# Patient Record
Sex: Female | Born: 1960 | Race: Black or African American | Hispanic: No
Health system: Southern US, Community
[De-identification: ages and names within clinical notes are randomized; demographics above are authoritative.]

## PROBLEM LIST (undated history)

## (undated) DIAGNOSIS — I639 Cerebral infarction, unspecified: Secondary | ICD-10-CM

## (undated) HISTORY — DX: Cerebral infarction, unspecified: I63.9

---

## 2008-04-22 DIAGNOSIS — I639 Cerebral infarction, unspecified: Secondary | ICD-10-CM

## 2008-04-22 HISTORY — DX: Cerebral infarction, unspecified: I63.9

## 2018-03-16 ENCOUNTER — Other Ambulatory Visit (HOSPITAL_COMMUNITY): Payer: Self-pay | Admitting: Family Medicine

## 2018-03-16 DIAGNOSIS — R05 Cough: Secondary | ICD-10-CM

## 2018-03-16 DIAGNOSIS — R053 Chronic cough: Secondary | ICD-10-CM

## 2018-03-18 ENCOUNTER — Encounter (HOSPITAL_COMMUNITY): Payer: Self-pay

## 2018-03-18 ENCOUNTER — Ambulatory Visit (HOSPITAL_COMMUNITY): Payer: Medicaid Other

## 2018-03-23 ENCOUNTER — Ambulatory Visit (HOSPITAL_COMMUNITY)
Admission: RE | Admit: 2018-03-23 | Discharge: 2018-03-23 | Disposition: A | Discharge status: Home / Self Care | Payer: Medicaid Other | Source: Ambulatory Visit | Attending: Family Medicine | Admitting: Family Medicine

## 2018-03-23 ENCOUNTER — Encounter (HOSPITAL_COMMUNITY): Payer: Self-pay

## 2018-03-23 DIAGNOSIS — R05 Cough: Secondary | ICD-10-CM | POA: Insufficient documentation

## 2018-03-23 DIAGNOSIS — R053 Chronic cough: Secondary | ICD-10-CM

## 2018-03-27 ENCOUNTER — Ambulatory Visit: Payer: Medicaid Other | Admitting: Physical Therapy

## 2018-04-01 ENCOUNTER — Ambulatory Visit: Payer: Medicaid Other | Admitting: Physical Therapy

## 2018-04-08 ENCOUNTER — Other Ambulatory Visit: Payer: Self-pay

## 2018-04-08 ENCOUNTER — Ambulatory Visit: Payer: Medicaid Other | Attending: Family Medicine | Admitting: Physical Therapy

## 2018-04-08 ENCOUNTER — Encounter: Payer: Self-pay | Admitting: Physical Therapy

## 2018-04-08 DIAGNOSIS — R296 Repeated falls: Secondary | ICD-10-CM | POA: Insufficient documentation

## 2018-04-08 DIAGNOSIS — R2689 Other abnormalities of gait and mobility: Secondary | ICD-10-CM | POA: Insufficient documentation

## 2018-04-08 DIAGNOSIS — I69951 Hemiplegia and hemiparesis following unspecified cerebrovascular disease affecting right dominant side: Secondary | ICD-10-CM | POA: Diagnosis not present

## 2018-04-08 DIAGNOSIS — R2681 Unsteadiness on feet: Secondary | ICD-10-CM | POA: Diagnosis present

## 2018-04-08 NOTE — Therapy (Addendum)
[facility] [address] [place], [place], [address] Phone: [phone]   Fax:  [phone]  Physical Therapy Evaluation  Patient Details  Name: Megan Carson MRN: [id number] Date of Birth: 1960-10-04 Referring Provider (PT): Laruth Bouchard, MD   Encounter Date: 04/08/2018  PT End of Session - 04/08/18 1740    Visit Number  1    Number of Visits  4    Date for PT Re-Evaluation  --   TBD based on Medicaid approval dates   Authorization Type  Medicaid    Authorization Time Period  TBD    Authorization - Visit Number  0    Authorization - Number of Visits  3    PT Start Time  [phone]    PT Stop Time  0940    PT Time Calculation (min)  48 min    Activity Tolerance  Patient tolerated treatment well    Behavior During Therapy  [facility] for tasks assessed/performed       Past Medical History:  Diagnosis Date  . Stroke [facility]) 2010    History reviewed. No pertinent surgical history.  There were no vitals filed for this visit.   Subjective Assessment - 04/08/18 0900    Subjective  Came for PT because she is weak. She can't move her hand. Her leg is heavy. Entire Rt side is weak. Lots of falls since stroke in 2010. Had therapy in [place]. 2 years ago her hand got worse but did not see a physician or have any testing done. Came to the [place] 7-8 months ago. Has not had any recent scans of brain.     Patient is accompained by:  Family member   daughter, "Kurtis Bushman"   Pertinent History  CVA 2010    Patient Stated Goals  Wants to get her strength on her right side back, including her rt hand. Improve her walking and balance to stop falls    Currently in Pain?  No/denies   initially answered yes, but with more ?'s (interpreted thru daughter) determined she was describing weakness in RLE   Pain Location  --    Pain Orientation  --    Pain Descriptors / Indicators  --   more a weak feeling    Pain Type  --    Pain Onset  --    Pain Frequency  --         [facility] PT Assessment - 04/08/18 0907      Assessment   Medical Diagnosis  falls;gait,balance    Referring Provider (PT)  Laruth Bouchard, MD    Onset Date/Surgical Date  --   fell 2 months ago   Hand Dominance  Right   proir to CVA rt; now uses left mostly   Prior Therapy  in 2010 in [place] & then came to [place], [place] with more therapy      Precautions   Precautions  Fall      Restrictions   Weight Bearing Restrictions  No      Balance Screen   Has the patient fallen in the past 6 months  Yes    How many times?  --   unsure; last fall was 2 months ago; feels off-balance   Has the patient had a decrease in activity level because of a fear of falling?   No    Is the patient reluctant to leave their home because of a fear of falling?   No  Home Environment   Living Environment  Private residence    Living Arrangements  Children   daughter, niece, nephew   Available Help at Discharge  Family;Available 24 hours/day    Type of Home  Apartment    Home Access  Level entry    Home Layout  Two level;Bed/bath upstairs    Alternate Level Stairs-Number of Steps  14    Alternate Level Stairs-Rails  Right    Home Equipment  None      Prior Function   Level of Independence  Needs assistance with ADLs;Independent with gait   needs help with bra; still cooks and cleans   Comments  expressive aphasia; was fluent in English prior to CVA, has struggled to speak English since CVA      Cognition   Overall Cognitive Status  History of cognitive impairments - at baseline   memory is a bit worse per daughter     Observation/Other Assessments   Observations  smiles, good eye contact, asks follow-up questions appropriately (via her daughter who she has chosen to [employer]interpret--form signed by pt/daughter)      Education administratorensation   Light Touch  --   denies numbness     Coordination   Gross Motor Movements are Fluid and Coordinated  No    Fine Motor Movements are Fluid and  Coordinated  No    Coordination and Movement Description  rapid alternating toe taps--very limited on rt     Heel Shin Test  decr weith RLE      Posture/Postural Control   Posture/Postural Control  No significant limitations      ROM / Strength   AROM / PROM / Strength  AROM;Strength      AROM   Overall AROM   Deficits    Overall AROM Comments  RUE due to weakness; [facility]      Strength   Overall Strength  Deficits    Strength Assessment Site  Shoulder;Elbow;Hand;Hip;Knee;Ankle    Right/Left Shoulder  Right;Left    Right Shoulder Flexion  4/5    Left Shoulder Flexion  5/5    Right/Left Elbow  Right;Left    Right Elbow Flexion  4/5    Right Elbow Extension  3+/5    Left Elbow Flexion  5/5    Left Elbow Extension  5/5    Right/Left Hip  Right;Left    Right Hip Flexion  4+/5    Left Hip Flexion  5/5    Right/Left Knee  Right;Left    Right Knee Flexion  3+/5    Right Knee Extension  4+/5    Left Knee Flexion  5/5    Left Knee Extension  5/5    Right/Left Ankle  Right;Left    Right Ankle Dorsiflexion  3+/5    Left Ankle Dorsiflexion  5/5      Transfers   Transfers  Sit to Stand    Sit to Stand  7: Independent;Without upper extremity assist;From chair/3-in-1    Five time sit to stand comments   21.0   for CVA >12 sec indicative of incr fall risk   Comments  uses LLE primarily (left foot further behind rt, wt-shifted over LLE)      Ambulation/Gait   Ambulation/Gait  Yes    Ambulation/Gait Assistance  4: Min guard    Ambulation Distance (Feet)  40 Feet   140   Assistive device  None    Gait Pattern  Step-through pattern;Decreased arm swing - right;Decreased step length -  left;Decreased stance time - right;Decreased weight shift to right;Trendelenburg;Narrow base of support    Ambulation Surface  Level;Indoor    Gait velocity  32.8/15.87=2.07 Ft/sec   norm 3.6 ft/sec   Gait Comments  with head turns pt dirfits >6" off path                Objective  measurements completed on examination: See above findings.              PT Education - 04/08/18 1737    Education Details  results of evaluation with her scores compared to norm for women her age; difficulties with use of her hand would need to be addressed through OT (pt is interested in pursuing OT referral and evaluation)    Person(s) Educated  Patient;Child(ren)    Methods  Explanation    Comprehension  Verbalized understanding       PT Short Term Goals - 04/08/18 1827      PT SHORT TERM GOAL #1   Title  Patient will be complete HEP with up to minguard assist (for balance activities) provided by family members (Target for STGs by 3rd visit)    Baseline  04/08/18 no current HEP    Status  New      PT SHORT TERM GOAL #2   Title  Patient will improve 5x sit to stand to <18 seconds     Baseline  04/08/18 21. 0 sec    Status  New      PT SHORT TERM GOAL #3   Title  Patient will improve her gait velocity to >2.5 ft/sec, showing progress towards goal of >2.62 ft/sec for safe community ambulation    Baseline  04/08/18 2.07 ft/sec      PT SHORT TERM GOAL #4   Title  Patient will demonstrate safe use of LRAD to improve safety with mobility and reduce fall risk.     Baseline  04/08/18 pt currently uses no assistive device                Plan - 04/08/18 1744    Clinical Impression Statement  Patient referred to OPPT by her PCP due to falls (R29.6). Nothing in pt's EMR (including Care Everywhere) except a referral note to ENT due to hearing loss. All history provided by patient via her daughter as her interpreter. Patient reports she falls often since her CVA (which effected her right side) in 2010. She reports she last fell 2 months ago and was not injured. She denies dizziness with falls, instead reporting her leg gives out due to weakness or imbalance when turning. She reports she did undergo PT after her CVA and made good progress, however noted incr weakness ~2 years  ago for which she did not seek medical care. Patient's outcome measures taken today both indicated she as at incr fall risk. Educated patient that the goal of PT will be to reduce her fall risk and that she will have to do her home exercises (when assigned) in order to improve. She vigorously acknowledged understanding her need to do her HEP. Anticipate patient can reduce her fall risk with the PT interventions listed below to address the deficits listed below.     History and Personal Factors relevant to plan of care:  PMH-CVA effecting rt side in 2010; denies further PMH; Personal factors-unknown rehab potential or expected progression due to length of time since CVA    Clinical Presentation  Stable    Clinical Presentation due to:  no recent medical changes    Clinical Decision Making  Low    Rehab Potential  Good    Clinical Impairments Affecting Rehab Potential  decr RUE strength (esp grip) will potentially limit use of an assistive device    PT Frequency  1x / week    PT Duration  3 weeks    PT Treatment/Interventions  ADLs/Self Care Home Management;DME Instruction;Gait training;Stair training;Functional mobility training;Therapeutic activities;Therapeutic exercise;Balance training;Orthotic Fit/Training;Patient/family education;Neuromuscular re-education;Manual techniques;Passive range of motion    PT Next Visit Plan  initiate HEP (sit to stand with rt foot closer, torso in midline, and rt knee control; single leg standing with ipsilat UE support; stand hip abduction (?theraband); sideways and backwards walking at counter; ?corner ex's with head turns)    Recommended Other Services  OT for addressing UE deficits with ADLs (pt cannot do her own hair, fasten her bra)    Consulted and Agree with Plan of Care  Patient;Family member/caregiver    Family Member Consulted  daughter "Say-mah"       Patient will benefit from skilled therapeutic intervention in order to improve the following deficits and  impairments:  Abnormal gait, Decreased balance, Decreased coordination, Decreased knowledge of use of DME, Decreased safety awareness, Decreased strength, Impaired UE functional use  Visit Diagnosis: 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 Stroke [facility]) 04/22/2008    Zena Amos, PT 04/08/2018, 6:59 PM  [facility] [address] [place], [place], [address] Phone: [phone]   Fax:  [phone]  Name: Megan Carson MRN: [id number] Date of Birth: March 25, 1961

## 2018-04-27 ENCOUNTER — Encounter: Payer: Self-pay | Admitting: Physical Therapy

## 2018-04-27 ENCOUNTER — Ambulatory Visit: Payer: Medicaid Other | Attending: Family Medicine | Admitting: Physical Therapy

## 2018-04-27 DIAGNOSIS — R2681 Unsteadiness on feet: Secondary | ICD-10-CM | POA: Insufficient documentation

## 2018-04-27 DIAGNOSIS — R2689 Other abnormalities of gait and mobility: Secondary | ICD-10-CM | POA: Insufficient documentation

## 2018-04-27 DIAGNOSIS — R296 Repeated falls: Secondary | ICD-10-CM | POA: Insufficient documentation

## 2018-04-27 DIAGNOSIS — I69951 Hemiplegia and hemiparesis following unspecified cerebrovascular disease affecting right dominant side: Secondary | ICD-10-CM | POA: Diagnosis not present

## 2018-04-27 NOTE — Patient Instructions (Signed)
Access Code: [email or web address]  URL: [email or web address]  Date: 04/27/2018  Prepared by: Veda Canning   Exercises  Backward Walking with Counter Support - 5 reps - 1 sets - 1x daily - 7x weekly  Single Leg Bridge - 10 reps - 1 sets - 5 seconds hold - 1x daily - 3x weekly  Side step at counter - 10 reps - 1 sets - - hold - 1x daily - 3x weekly  Sit to stand in staggered stance - 10 reps - 1 sets - - hold - 1x daily - 7x weekly

## 2018-04-28 NOTE — Therapy (Signed)
[facility] [address] [place], [place], [address] Phone: [phone]   Fax:  [phone]  Physical Therapy Treatment  Patient Details  Name: Megan Carson MRN: [id number] Date of Birth: 18-Nov-1960 Referring Provider (PT): Laruth Bouchard, MD   Encounter Date: 04/27/2018  PT End of Session - 04/27/18 0931    Visit Number  2    Number of Visits  4    Date for PT Re-Evaluation  --   TBD based on Medicaid approval dates   Authorization Type  Medicaid    Authorization Time Period  TBD    Authorization - Visit Number  1    Authorization - Number of Visits  3    PT Start Time  0847    PT Stop Time  0931    PT Time Calculation (min)  44 min    Activity Tolerance  Patient tolerated treatment well    Behavior During Therapy  [facility] for tasks assessed/performed       Past Medical History:  Diagnosis Date  . Stroke [facility]) 2010    History reviewed. No pertinent surgical history.  There were no vitals filed for this visit.  Subjective Assessment - 04/27/18 1700    Subjective  "i'm lazy" when discussing need to do PT HEP.     Patient is accompained by:  Family member   daughter   Pertinent History  CVA 2010    Patient Stated Goals  Wants to get her strength on her right side back, including her rt hand. Improve her walking and balance to stop falls    Currently in Pain?  No/denies                       [facility] Adult PT Treatment/Exercise - 04/27/18 1700      Transfers   Transfers  Sit to Stand;Stand to Sit    Sit to Stand  4: Min assist;4: Min guard    Sit to Stand Details (indicate cue type and reason)  Rt foot closer in staggered stance pt still centers herself over LLE to power up; placed left hand on rt knee and requires min assist to stand from mat table with LOB posteriorly multiple times    Number of Reps  10 reps;2 sets   then 5 reps RUE reaching for chair back for ant wtshift     Ambulation/Gait    Ambulation/Gait Assistance  5: Supervision;4: Min guard    Ambulation/Gait Assistance Details  after fatigue from ex's needed minguard for safety with RLE instability    Ambulation Distance (Feet)  60 Feet   20 x 2; 40   Assistive device  None    Gait Pattern  Step-through pattern;Decreased arm swing - right;Decreased step length - left;Decreased stance time - right;Decreased weight shift to right;Trendelenburg;Narrow base of support;Right foot flat;Right genu recurvatum      Knee/Hip Exercises: Standing   Hip Abduction  Stengthening;Both;1 set;20 reps;Knee straight    Abduction Limitations  attempted leg lift, poor control with LOB, moved to       Knee/Hip Exercises: Supine   Single Leg Bridge  Strengthening;Right;1 set;10 reps   5 sec hold              PT Short Term Goals - 04/08/18 1827      PT SHORT TERM GOAL #1   Title  Patient will be complete HEP with up to minguard assist (for balance activities) provided by family members (  Target for STGs by 3rd visit)    Baseline  04/08/18 no current HEP    Status  New      PT SHORT TERM GOAL #2   Title  Patient will improve 5x sit to stand to <18 seconds     Baseline  04/08/18 21. 0 sec    Status  New      PT SHORT TERM GOAL #3   Title  Patient will improve her gait velocity to >2.5 ft/sec, showing progress towards goal of >2.62 ft/sec for safe community ambulation    Baseline  04/08/18 2.07 ft/sec      PT SHORT TERM GOAL #4   Title  Patient will demonstrate safe use of LRAD to improve safety with mobility and reduce fall risk.     Baseline  04/08/18 pt currently uses no assistive device               Plan - 04/28/18 1558    Clinical Impression Statement  Session focused on initiating HEP for LE balance and strength. Patient requires increased facilitation to use LLE less and RLE more as she has "learned disuse" of RLE. Patient moves quickly with poor control with multiple LOB requiring assist to recover  throughout session. These LOB restricted the exercises issued for HEP as pt would need assist to safely complete. Per pt, she is lazy when it comes to doing her exercises and may need family assist for balance as well as for compliance.     Rehab Potential  Good    Clinical Impairments Affecting Rehab Potential  decr RUE strength (esp grip) will potentially limit use of an assistive device    PT Frequency  1x / week    PT Duration  3 weeks    PT Treatment/Interventions  ADLs/Self Care Home Management;DME Instruction;Gait training;Stair training;Functional mobility training;Therapeutic activities;Therapeutic exercise;Balance training;Orthotic Fit/Training;Patient/family education;Neuromuscular re-education;Manual techniques;Passive range of motion    PT Next Visit Plan  did she do HEP? Need to emphasize her need to do HEP to improve and may need to enlist family to improve her compliance (and for assist with higher level balance challenges for safety); focus on pre-gait and gait training with ?introduce assistive device (rt hand will be tricky)    PT Home Exercise Plan  [email or web address]     Consulted and Agree with Plan of Care  Patient;Family member/caregiver    Family Member Consulted  daughter "Say-mah"       Patient will benefit from skilled therapeutic intervention in order to improve the following deficits and impairments:  Abnormal gait, Decreased balance, Decreased coordination, Decreased knowledge of use of DME, Decreased safety awareness, Decreased strength, Impaired UE functional use  Visit Diagnosis: 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 Stroke [facility]) 04/22/2008    Zena Amos, PT 04/28/2018, 4:10 PM  [facility] [address] [place], [place], [address] Phone:  [phone]   Fax:  [phone]  Name: Kami Maulden MRN: [id number] Date of Birth: [birth year]

## 2018-04-29 ENCOUNTER — Other Ambulatory Visit: Payer: Self-pay | Admitting: Physician Assistant

## 2018-04-29 ENCOUNTER — Telehealth: Payer: Self-pay | Admitting: Physical Therapy

## 2018-04-29 DIAGNOSIS — H905 Unspecified sensorineural hearing loss: Secondary | ICD-10-CM

## 2018-04-29 DIAGNOSIS — H9312 Tinnitus, left ear: Secondary | ICD-10-CM

## 2018-04-29 NOTE — Telephone Encounter (Signed)
Dr. Lazarus Salines,  Ms. Braff was evaluated by Physical Therapy on 04/08/18.  The patient would benefit from Occupational Therapy evaluation for UE weakness and increased tone.    If you agree, please place an order in [employer] workque in [facility] or fax the order to [phone]. Thank you,   Veda Canning, PT Outpatient Neurorehabilitation [address], [address] [place], [place] [address] [phone]

## 2018-05-05 ENCOUNTER — Encounter: Payer: Self-pay | Admitting: Physical Therapy

## 2018-05-05 ENCOUNTER — Ambulatory Visit: Payer: Medicaid Other | Admitting: Physical Therapy

## 2018-05-05 DIAGNOSIS — R2681 Unsteadiness on feet: Secondary | ICD-10-CM

## 2018-05-05 DIAGNOSIS — I69951 Hemiplegia and hemiparesis following unspecified cerebrovascular disease affecting right dominant side: Secondary | ICD-10-CM | POA: Diagnosis not present

## 2018-05-05 DIAGNOSIS — R2689 Other abnormalities of gait and mobility: Secondary | ICD-10-CM

## 2018-05-05 NOTE — Patient Instructions (Signed)
  Forward Weight Shift    Stand with left side close to counter or table and left hand on the counter. Stand with upright posture. Left leg in front of right. Shift weight forward onto left leg, and allow right knee to bend. Then shift weight back onto right leg.  __20_ reps per set, _2__ sets per day, __7_ days per week.   Copyright  [employer]. All rights reserved.

## 2018-05-05 NOTE — Therapy (Signed)
[facility] [address] [place], [place], [address] Phone: [phone]   Fax:  [phone]  Physical Therapy Treatment  Patient Details  Name: Megan Carson MRN: [id number] Date of Birth: June 30, 1960 Referring Provider (PT): Laruth Bouchard, MD   Encounter Date: 05/05/2018  PT End of Session - 05/05/18 0936    Visit Number  3    Number of Visits  4    Date for PT Re-Evaluation  05/17/18    Authorization Type  Medicaid    Authorization Time Period  04/27/18 to 05/17/18    Authorization - Visit Number  2    Authorization - Number of Visits  3    PT Start Time  0936   late arrival   PT Stop Time  1017    PT Time Calculation (min)  41 min    Activity Tolerance  Patient tolerated treatment well    Behavior During Therapy  [facility] for tasks assessed/performed       Past Medical History:  Diagnosis Date  . Stroke [facility]) 2010    History reviewed. No pertinent surgical history.  There were no vitals filed for this visit.  Subjective Assessment - 05/05/18 0939    Subjective  Reports she is doing her exercises every day. Family does assist her.     Patient is accompained by:  Interpreter   [facility]   Pertinent History  CVA 2010    Patient Stated Goals  Wants to get her strength on her right side back, including her rt hand. Improve her walking and balance to stop falls    Currently in Pain?  Yes    Pain Location  Leg    Pain Orientation  Right    Pain Descriptors / Indicators  Aching    Pain Type  Chronic pain    Pain Onset  More than a month ago    Pain Frequency  Intermittent    Aggravating Factors   activity    Pain Relieving Factors  rest    Effect of Pain on Daily Activities  limits abilityt to walk longer distance                       [facility] Adult PT Treatment/Exercise - 05/05/18 0001      Ambulation/Gait   Ambulation/Gait Assistance  6: Modified independent (Device/Increase time);4: Min  guard    Ambulation/Gait Assistance Details  pt with compensatory patterns that increase the spastic tone of RUE and RLE (rt hip hiking and circumduction); incr difficulty carrying over from pre-gait in // bars to gait with cane. Much improved with use of Lt HHA    Ambulation Distance (Feet)  80 Feet   25 x2, 40   Assistive device  Straight cane;1 person hand held assist;Parallel bars;None    Gait Pattern  Step-through pattern;Decreased arm swing - right;Decreased step length - left;Decreased stance time - right;Decreased weight shift to right;Trendelenburg;Right foot flat;Right genu recurvatum;Decreased trunk rotation;Abducted- right;Poor foot clearance - right;Right hip hike    Ambulation Surface  Level;Indoor    Pre-Gait Activities  // bars-staggered stance LLE in front, wt-shift onto LLE with rt knee flexing as approaching toe off, then shift wt back onto RLE (~40 reps as PT assisting RLE); progressed to then wt shift onto LLE, rt knee flexes and then advance RLE to heel strike; then progressed to walking the length of the // bars  PT Education - 05/05/18 1931    Education Details  need to work in slow, controlled ROM for RLE to improve rt foot clearnce.     Person(s) Educated  Patient    Methods  Explanation;Demonstration;Verbal cues    Comprehension  Verbalized understanding;Returned demonstration;Verbal cues required;Need further instruction       PT Short Term Goals - 04/08/18 1827      PT SHORT TERM GOAL #1   Title  Patient will be complete HEP with up to minguard assist (for balance activities) provided by family members (Target for STGs by 3rd visit)    Baseline  04/08/18 no current HEP    Status  New      PT SHORT TERM GOAL #2   Title  Patient will improve 5x sit to stand to <18 seconds     Baseline  04/08/18 21. 0 sec    Status  New      PT SHORT TERM GOAL #3   Title  Patient will improve her gait velocity to >2.5 ft/sec, showing progress towards goal of  >2.62 ft/sec for safe community ambulation    Baseline  04/08/18 2.07 ft/sec      PT SHORT TERM GOAL #4   Title  Patient will demonstrate safe use of LRAD to improve safety with mobility and reduce fall risk.     Baseline  04/08/18 pt currently uses no assistive device               Plan - 05/05/18 1932    Clinical Impression Statement  Session focused on pre-gait and gait training to address pt's goals of improved balance and walking. Utilizing the interpreter required incr time and pt required multiple repetitions to understand correct technique. Patient with poor abilitiyt to use SPC as we worked on carryover from pre-gait to gait training. Utilized single UE support (HHA) with pt better abl to focus on carryover of techniques for right foot clarance. Progressed to Lt HHA only.     Rehab Potential  Good    Clinical Impairments Affecting Rehab Potential  decr RUE strength (esp grip) will potentially limit use of an assistive device    PT Frequency  1x / week    PT Duration  3 weeks    PT Treatment/Interventions  ADLs/Self Care Home Management;DME Instruction;Gait training;Stair training;Functional mobility training;Therapeutic activities;Therapeutic exercise;Balance training;Orthotic Fit/Training;Patient/family education;Neuromuscular re-education;Manual techniques;Passive range of motion    PT Next Visit Plan  assess STGs and recertify if progressing; did she do HEP? 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 Stroke [facility]) 04/22/2008    Zena Amos, PT 05/05/2018, 8:01 PM  [facility] [address] [place], [place], [address] Phone: [phone]  Fax:  [phone]  Name: Desma Paganinigbal Hendershott MRN: [id number] Date of Birth: 12/23/1960

## 2018-05-12 ENCOUNTER — Ambulatory Visit: Payer: Medicaid Other | Admitting: Physical Therapy

## 2018-05-12 ENCOUNTER — Encounter: Payer: Self-pay | Admitting: Physical Therapy

## 2018-05-12 DIAGNOSIS — R2681 Unsteadiness on feet: Secondary | ICD-10-CM

## 2018-05-12 DIAGNOSIS — R296 Repeated falls: Secondary | ICD-10-CM

## 2018-05-12 DIAGNOSIS — I69951 Hemiplegia and hemiparesis following unspecified cerebrovascular disease affecting right dominant side: Secondary | ICD-10-CM

## 2018-05-12 DIAGNOSIS — R2689 Other abnormalities of gait and mobility: Secondary | ICD-10-CM

## 2018-05-12 NOTE — Patient Instructions (Signed)
Forward Walk at [employer] with left side close to counter or table and left hand on the counter. Stand with upright posture. Left leg in front of right. Shift weight forward onto left leg, and allow right knee to bend. Then step right foot forward landing with heel first. Then shift forward onto right leg (don't let right hip drop out to the side) and then step left foot forward. Repeat the length of the counter. __10_ lengths per set, _2__ sets per day, __7_ days per week.

## 2018-05-12 NOTE — Therapy (Signed)
[place] [address] [place], [place], [address] Phone: [phone]   Fax:  [phone]  Physical Therapy Treatment  Patient Details  Name: Megan Carson MRN: [id number] Date of Birth: 08/04/1960 Referring Provider (PT): Vassie Moment, MD   Encounter Date: 05/12/2018  PT End of Session - 05/12/18 0844    Visit Number  4    Number of Visits  4 (eval plus treatments)   Date for PT Re-Evaluation  05/17/18    Authorization Type  Medicaid    Authorization Time Period  04/27/18 to 05/17/18    Authorization - Visit Number  3    Authorization - Number of Visits  3  (requesting additional 8 visits)   PT Start Time  0845    PT Stop Time  0930    PT Time Calculation (min)  45 min    Activity Tolerance  Patient tolerated treatment well    Behavior During Therapy  [facility] for tasks assessed/performed       Past Medical History:  Diagnosis Date  . Stroke [facility]) 2010    History reviewed. No pertinent surgical history.  There were no vitals filed for this visit.  Subjective Assessment - 05/12/18 0845    Subjective  Doing exercises and feels they are helping.     Patient is accompained by:  Interpreter   Jameel   Pertinent History  CVA 2010    Patient Stated Goals  Wants to get her strength on her right side back, including her rt hand. Improve her walking and balance to stop falls    Currently in Pain?  No/denies    Pain Onset  --                       [facility] Adult PT Treatment/Exercise - 05/12/18 0854      Transfers   Five time sit to stand comments   23.94 sec   for CVA >12 sec indicative of incr fall risk     Ambulation/Gait   Ambulation/Gait Assistance  6: Modified independent (Device/Increase time);4: Min assist    Ambulation/Gait Assistance Details  with compensatory rt hip hike to advance RLE with limited rt knee flexion; after pre-gait with Lt HHA or SPC minguard assist for safety; max cues for  sequencing with cane    Ambulation Distance (Feet)  100 Feet   120, 80, 60   Assistive device  Straight cane;1 person hand held assist;Parallel bars;None    Gait Pattern  Step-through pattern;Decreased arm swing - right;Decreased step length - left;Decreased stance time - right;Decreased weight shift to right;Trendelenburg;Right foot flat;Decreased trunk rotation;Poor foot clearance - right;Right hip hike    Ambulation Surface  Level;Indoor    Gait velocity  32.8/15.31=2.14 ft/sec    Pre-Gait Activities  // bars-staggered stance LLE in front, wt-shift onto LLE with rt knee flexing as approaching toe off, then shift wt back onto RLE; progressed to then wt shift onto LLE, rt knee flexes and then advance RLE to heel strike through foot flat with rt hip "tucked in"; then progressed to walking the length of the // bars             PT Education - 05/12/18 2025    Education Details  results of STG assessment; process for requesting additional visits; updated HEP    Person(s) Educated  Patient    Methods  Explanation;Demonstration;Tactile cues;Verbal cues;Handout    Comprehension  Verbalized understanding;Returned demonstration  PT Short Term Goals - 05/12/18 2027      PT SHORT TERM GOAL #1   Title  Patient will be complete HEP with up to minguard assist (for balance activities) provided by family members (Target for STGs by 3rd visit)    Baseline  04/08/18 no current HEP    Status  Achieved      PT SHORT TERM GOAL #2   Title  Patient will improve 5x sit to stand to <18 seconds     Baseline  04/08/18 21. 0 sec;  05/12/18 23.94 sec    Status  Not Met      PT SHORT TERM GOAL #3   Title  Patient will improve her gait velocity to >2.5 ft/sec, showing progress towards goal of >2.62 ft/sec for safe community ambulation    Baseline  04/08/18 2.07 ft/sec;  05/12/18  2.14 ft/sec    Status  Partially Met      PT SHORT TERM GOAL #4   Title  Patient will demonstrate safe use of LRAD to  improve safety with mobility and reduce fall risk.     Baseline  04/08/18 pt currently uses no assistive device; 05/12/18 initiated use of cane, however pt not yet proficient with it's use    Status  On-going        New LTGs (due by visit #11)    PT Long Term Goals - 05/12/18 2116      PT LONG TERM GOAL #1   Title  Patient will perform updated HEP with guidance from her family (as needed for technique). Target for all LTGs--at visit #11 (3 visits plus 8 additional requested)    Baseline  04/08/18 no current HEP; 05/12/18 completing basic HEP as prescribed    Time  4    Period  Weeks    Status  New      PT LONG TERM GOAL #2   Title  Patient will improve 5x sit to stand to <18 seconds     Baseline  04/08/18 21. 0 sec; 05/12/18 23.94 sec    Time  4    Period  Weeks    Status  On-going      PT LONG TERM GOAL #3   Title  Patient will improve her gait velocity to >2.5 ft/sec, showing progress towards goal of >2.62 ft/sec for safe community ambulation    Baseline  04/08/18 2.07 ft/sec; 05/12/18 2.14 ft/sec    Status  On-going      PT LONG TERM GOAL #4   Title  Patient will demonstrate safe use of LRAD to improve safety with mobility and reduce fall risk.     Baseline  04/08/18 pt currently uses no assistive device; 05/12/18 initiated use of cane, however pt not yet proficient with it's use    Status  On-going            Plan - 05/12/18 2031    Clinical Impression Statement  Session focused on STG assessment with pt meeting 1 of 4 goals, partially meeting 1 goal (improved but not to goal level), 1 goal is ongoing, and final goal was not met. Remainder of session focused on improving safety with gait with pt responding well to instructions (via interpreter) to improve her use of RLE. Introduced use of straight cane for improved stability and safety with ambulation. Patient with difficulty coodinating with cane, however became more proficient with practice. She did not improve  sufficiently during one session to yet recommend she obtain  or use a cane for home. Over the course of these initial three visits, the patient has demonstrated her ability to respond to physical and verbal cues to alter her movement patterns. With additional PT visits, anticipate that she will improve resulting in a lower risk of falling. Will request an additional 8 visits to pursue the new and ongoing goals as outlined.     Rehab Potential  Good    Clinical Impairments Affecting Rehab Potential  decr RUE strength (esp grip) will potentially limit use of an assistive device    PT Frequency  2x / week   PT Duration  4 weeks    PT Treatment/Interventions  ADLs/Self Care Home Management;DME Instruction;Gait training;Stair training;Functional mobility training;Therapeutic activities;Therapeutic exercise;Balance training;Orthotic Fit/Training;Patient/family education;Neuromuscular re-education;Manual techniques;Passive range of motion    PT Next Visit Plan  balance training; focus on pre-gait and gait training with ? 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 Stroke [facility]) 04/22/2008    Rexanne Mano, PT 05/12/2018, 9:26 PM  [facility] [address] [place], [place], [address] Phone: [phone]   Fax:  [phone]  Name: Breon Diss MRN: [id number] Date of Birth: 1960/05/16

## 2018-06-02 ENCOUNTER — Encounter: Payer: Self-pay | Admitting: Gastroenterology

## 2018-06-03 ENCOUNTER — Other Ambulatory Visit: Payer: Self-pay | Admitting: Family Medicine

## 2018-06-03 DIAGNOSIS — Z1231 Encounter for screening mammogram for malignant neoplasm of breast: Secondary | ICD-10-CM

## 2018-06-05 ENCOUNTER — Ambulatory Visit
Admission: RE | Admit: 2018-06-05 | Discharge: 2018-06-05 | Disposition: A | Discharge status: Home / Self Care | Payer: Medicaid Other | Source: Ambulatory Visit | Attending: Family Medicine | Admitting: Family Medicine

## 2018-06-05 ENCOUNTER — Other Ambulatory Visit: Payer: Self-pay | Admitting: Family Medicine

## 2018-06-05 DIAGNOSIS — G8929 Other chronic pain: Secondary | ICD-10-CM

## 2018-06-05 DIAGNOSIS — M549 Dorsalgia, unspecified: Principal | ICD-10-CM

## 2018-06-24 ENCOUNTER — Telehealth: Payer: Self-pay | Admitting: *Deleted

## 2018-06-24 NOTE — Telephone Encounter (Signed)
Pt was scheduled for A 60 minute PV today- Interpreter was here but pt no showed. Attempted to call pt, There was no answer. LM to CB and RS. Mailed a NS letter as well to pt.  I cancelled the PCV and the 3-18 colonoscopy, Megan Carson pV

## 2018-07-01 ENCOUNTER — Ambulatory Visit: Payer: Medicaid Other

## 2018-07-06 ENCOUNTER — Telehealth: Payer: Self-pay | Admitting: *Deleted

## 2018-07-06 NOTE — Telephone Encounter (Signed)
Covid-19 travel screening questions  Have you traveled in the last 14 days? NO If yes where?  Do you now or have you had a fever in the last 14 days? NO  Do you have any respiratory symptoms of shortness of breath or cough now or in the last 14 days? NO  Do you have a medical history of Congestive Heart Failure? NO  Do you have a medical history of lung disease? NO  Do you have any family members or close contacts with diagnosed or suspected Covid-19? NO        

## 2018-07-08 ENCOUNTER — Encounter: Payer: Medicaid Other | Admitting: Gastroenterology

## 2018-07-21 ENCOUNTER — Encounter: Payer: Medicaid Other | Admitting: Gastroenterology

## 2018-07-27 ENCOUNTER — Ambulatory Visit: Payer: Medicaid Other

## 2018-09-02 ENCOUNTER — Encounter: Payer: Self-pay | Admitting: Family Medicine

## 2020-06-10 IMAGING — CR DG LUMBAR SPINE COMPLETE 4+V
5 series · 5 of 5 positions shown · non-contrast
Comparison: None.

CLINICAL DATA: Chronic low back pain

EXAM:
LUMBAR SPINE - COMPLETE 4+ VIEW

[t l-spine a.p.]
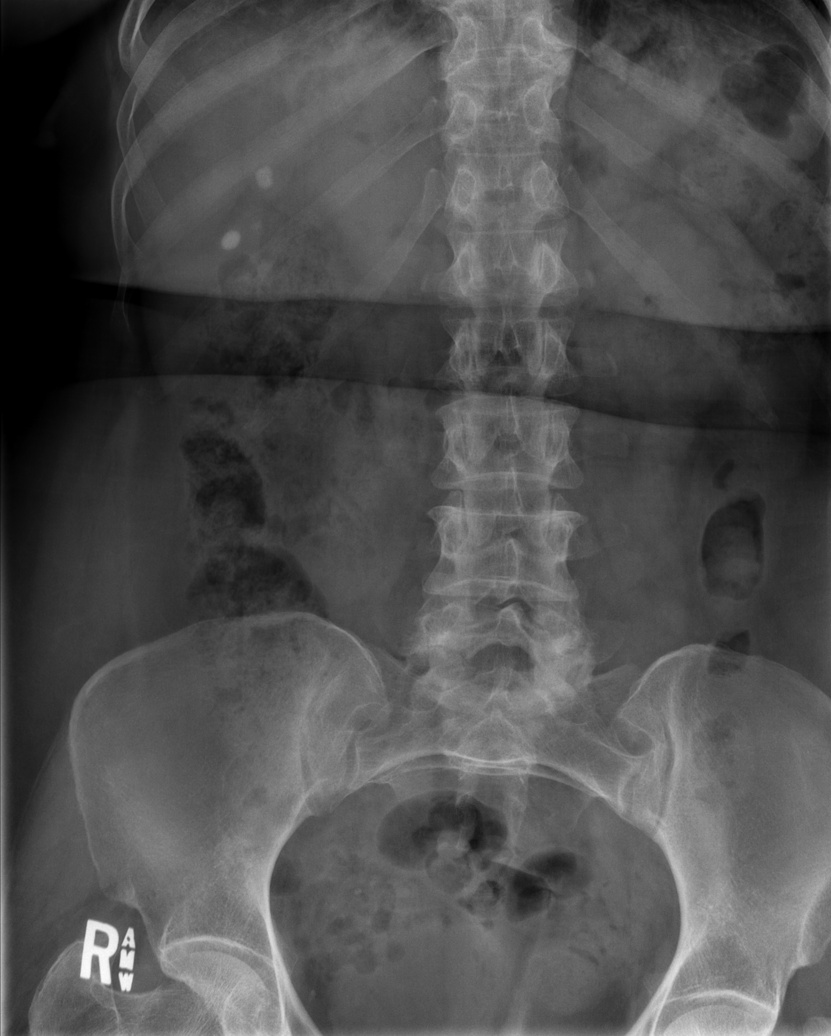

[t l-spine oblique exposure (1 of 2)]
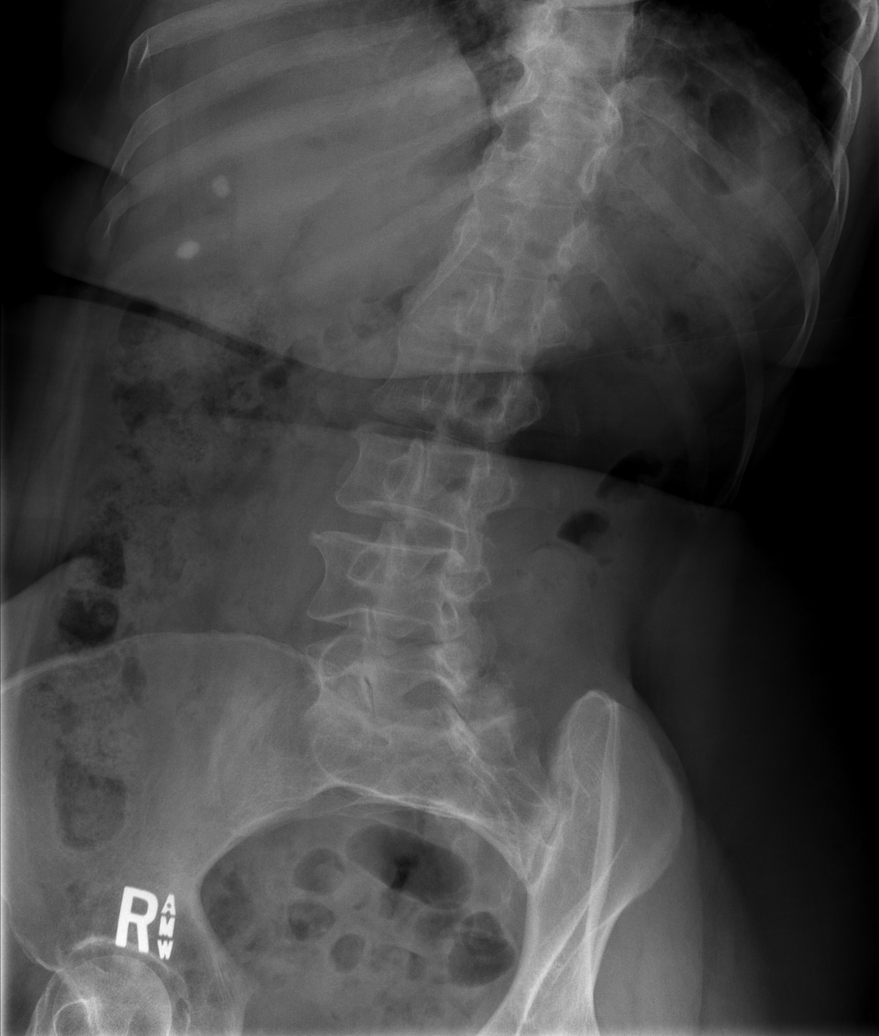

[t l-spine oblique exposure (2 of 2)]
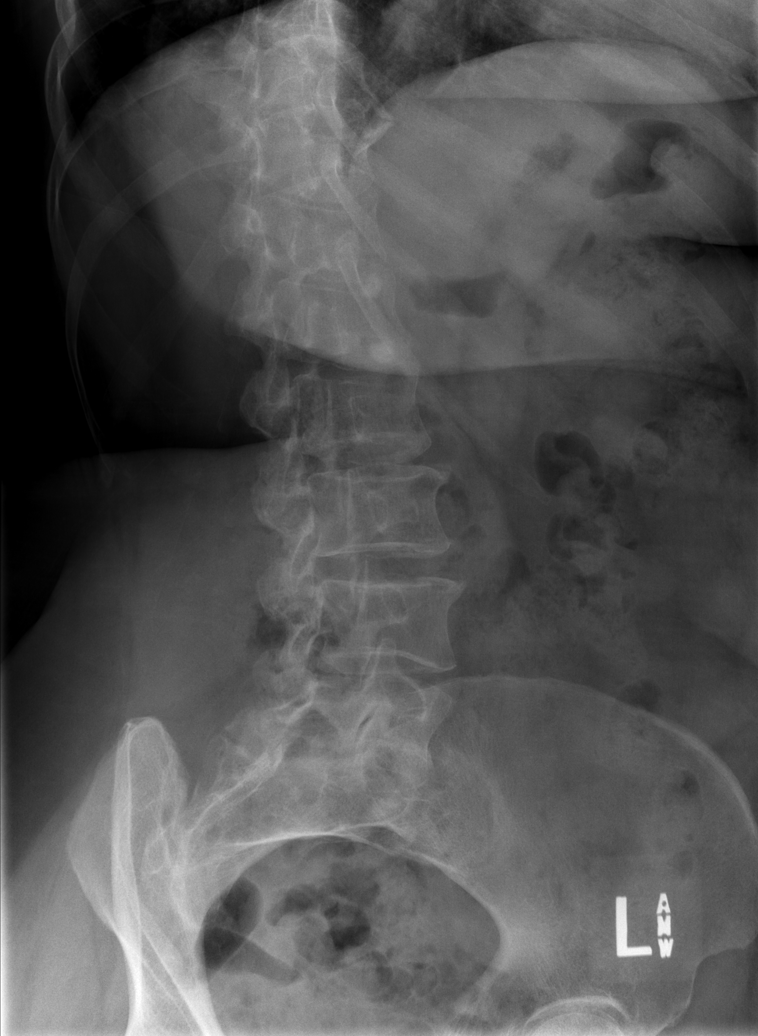

[t l-spine lat]
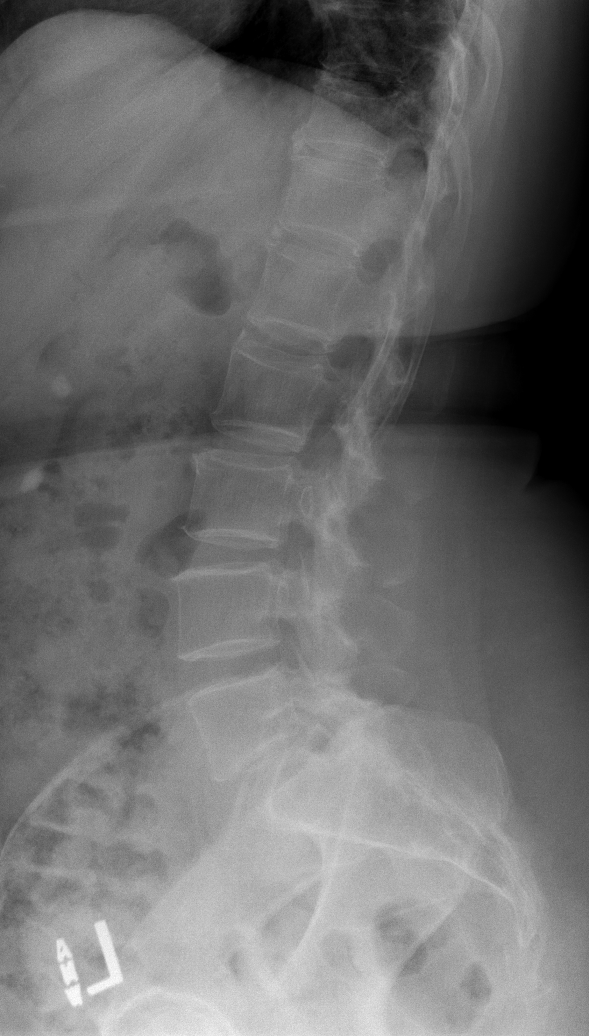

[t l-spine l5-s1 spot]
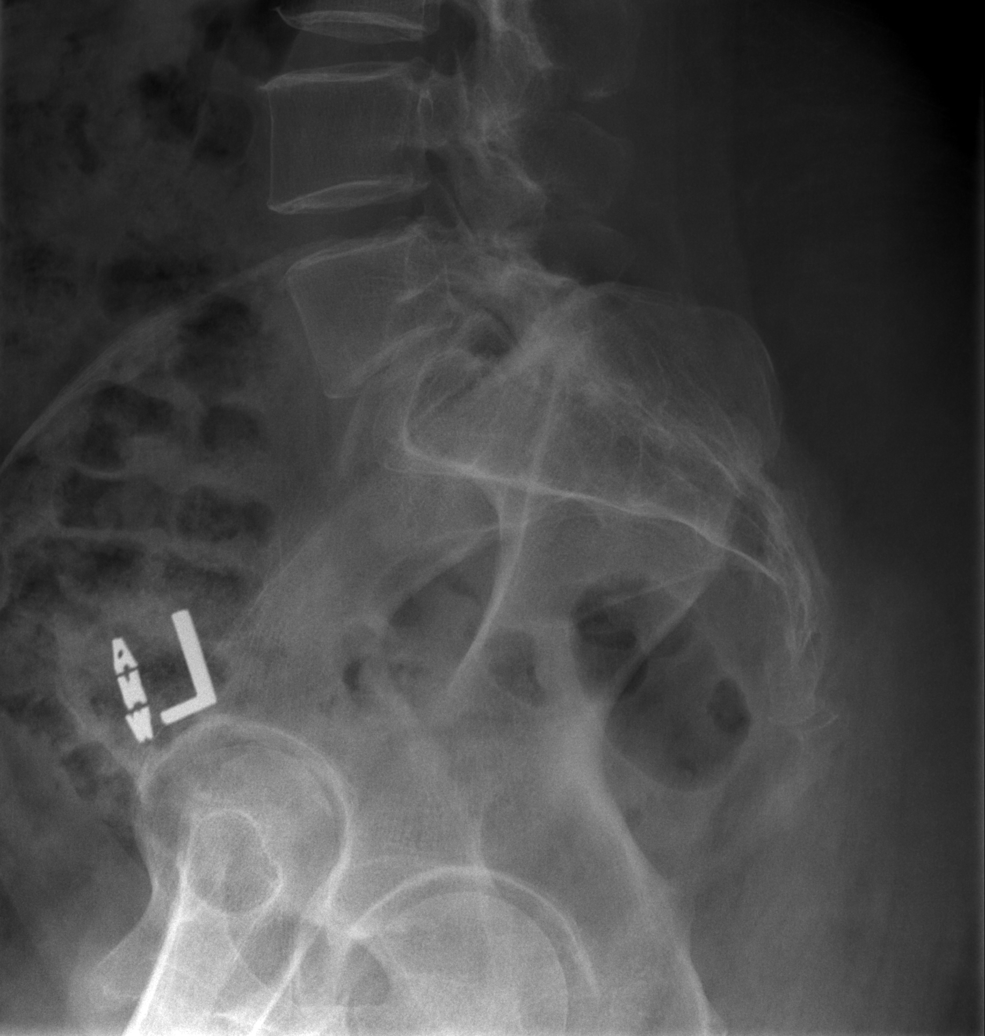

[5 of 5 positions shown; findings below may reference images not displayed]

FINDINGS: Mild degenerative anterolisthesis of L5. The other lumbar vertebral
bodies are normally aligned. Disc spaces are preserved. Moderate
lower lumbar facet disease. No acute bony findings or worrisome bone
lesions. Right upper quadrant calcifications are likely gallstones.

The visualized bony pelvis is intact.  The SI joints appear normal.
IMPRESSION: Mild degenerative anterolisthesis of L5.

No acute bony findings.
